# Patient Record
Sex: Female | Born: 1955 | Race: White | Hispanic: No | Marital: Married | Smoking: Never smoker
Health system: Southern US, Community
[De-identification: ages and names within clinical notes are randomized; demographics above are authoritative.]

## PROBLEM LIST (undated history)

## (undated) DIAGNOSIS — G43909 Migraine, unspecified, not intractable, without status migrainosus: Secondary | ICD-10-CM

## (undated) DIAGNOSIS — M8580 Other specified disorders of bone density and structure, unspecified site: Secondary | ICD-10-CM

## (undated) HISTORY — PX: TONSILLECTOMY: SUR1361

## (undated) HISTORY — DX: Other specified disorders of bone density and structure, unspecified site: M85.80

## (undated) HISTORY — PX: BREAST BIOPSY: SHX20

## (undated) HISTORY — DX: Migraine, unspecified, not intractable, without status migrainosus: G43.909

---

## 1981-06-03 HISTORY — PX: OTHER SURGICAL HISTORY: SHX169

## 1998-03-24 ENCOUNTER — Other Ambulatory Visit: Admission: RE | Admit: 1998-03-24 | Discharge: 1998-03-24 | Payer: Self-pay | Admitting: Obstetrics and Gynecology

## 1998-03-27 ENCOUNTER — Other Ambulatory Visit: Admission: RE | Admit: 1998-03-27 | Discharge: 1998-03-27 | Payer: Self-pay | Admitting: Obstetrics and Gynecology

## 1999-04-04 ENCOUNTER — Encounter: Payer: Self-pay | Admitting: Obstetrics and Gynecology

## 1999-04-04 ENCOUNTER — Encounter: Admission: RE | Admit: 1999-04-04 | Discharge: 1999-04-04 | Payer: Self-pay | Admitting: Obstetrics and Gynecology

## 1999-05-11 ENCOUNTER — Ambulatory Visit (HOSPITAL_BASED_OUTPATIENT_CLINIC_OR_DEPARTMENT_OTHER): Admission: RE | Admit: 1999-05-11 | Discharge: 1999-05-11 | Payer: Self-pay | Admitting: Orthopedic Surgery

## 1999-06-13 ENCOUNTER — Other Ambulatory Visit: Admission: RE | Admit: 1999-06-13 | Discharge: 1999-06-13 | Payer: Self-pay | Admitting: Obstetrics and Gynecology

## 2000-05-13 ENCOUNTER — Encounter: Admission: RE | Admit: 2000-05-13 | Discharge: 2000-05-13 | Payer: Self-pay | Admitting: Obstetrics and Gynecology

## 2000-05-13 ENCOUNTER — Encounter: Payer: Self-pay | Admitting: Obstetrics and Gynecology

## 2000-07-22 ENCOUNTER — Other Ambulatory Visit: Admission: RE | Admit: 2000-07-22 | Discharge: 2000-07-22 | Payer: Self-pay | Admitting: Obstetrics and Gynecology

## 2001-07-31 ENCOUNTER — Encounter: Payer: Self-pay | Admitting: Obstetrics and Gynecology

## 2001-07-31 ENCOUNTER — Encounter: Admission: RE | Admit: 2001-07-31 | Discharge: 2001-07-31 | Payer: Self-pay | Admitting: Obstetrics and Gynecology

## 2001-08-07 ENCOUNTER — Encounter: Admission: RE | Admit: 2001-08-07 | Discharge: 2001-08-07 | Payer: Self-pay | Admitting: Obstetrics and Gynecology

## 2001-08-07 ENCOUNTER — Encounter: Payer: Self-pay | Admitting: Obstetrics and Gynecology

## 2001-10-30 ENCOUNTER — Other Ambulatory Visit: Admission: RE | Admit: 2001-10-30 | Discharge: 2001-10-30 | Payer: Self-pay | Admitting: Obstetrics and Gynecology

## 2002-07-29 ENCOUNTER — Ambulatory Visit (HOSPITAL_COMMUNITY): Admission: RE | Admit: 2002-07-29 | Discharge: 2002-07-29 | Payer: Self-pay | Admitting: *Deleted

## 2002-07-29 ENCOUNTER — Encounter: Payer: Self-pay | Admitting: *Deleted

## 2002-08-18 ENCOUNTER — Encounter: Admission: RE | Admit: 2002-08-18 | Discharge: 2002-08-18 | Payer: Self-pay | Admitting: Obstetrics and Gynecology

## 2002-08-18 ENCOUNTER — Encounter: Payer: Self-pay | Admitting: Obstetrics and Gynecology

## 2002-08-25 ENCOUNTER — Encounter: Admission: RE | Admit: 2002-08-25 | Discharge: 2002-08-25 | Payer: Self-pay | Admitting: Obstetrics and Gynecology

## 2002-08-25 ENCOUNTER — Encounter: Payer: Self-pay | Admitting: Obstetrics and Gynecology

## 2002-11-09 ENCOUNTER — Other Ambulatory Visit: Admission: RE | Admit: 2002-11-09 | Discharge: 2002-11-09 | Payer: Self-pay | Admitting: Obstetrics and Gynecology

## 2003-06-04 HISTORY — PX: OTHER SURGICAL HISTORY: SHX169

## 2003-07-12 ENCOUNTER — Other Ambulatory Visit: Admission: RE | Admit: 2003-07-12 | Discharge: 2003-07-12 | Payer: Self-pay | Admitting: Obstetrics and Gynecology

## 2003-08-17 ENCOUNTER — Encounter: Admission: RE | Admit: 2003-08-17 | Discharge: 2003-08-17 | Payer: Self-pay | Admitting: Obstetrics and Gynecology

## 2003-10-13 ENCOUNTER — Encounter: Admission: RE | Admit: 2003-10-13 | Discharge: 2003-10-13 | Payer: Self-pay | Admitting: Obstetrics and Gynecology

## 2003-10-23 ENCOUNTER — Ambulatory Visit (HOSPITAL_COMMUNITY): Admission: RE | Admit: 2003-10-23 | Discharge: 2003-10-23 | Payer: Self-pay | Admitting: Interventional Radiology

## 2003-11-21 ENCOUNTER — Observation Stay (HOSPITAL_COMMUNITY): Admission: RE | Admit: 2003-11-21 | Discharge: 2003-11-22 | Payer: Self-pay | Admitting: Interventional Radiology

## 2004-05-03 ENCOUNTER — Other Ambulatory Visit: Admission: RE | Admit: 2004-05-03 | Discharge: 2004-05-03 | Payer: Self-pay | Admitting: Obstetrics and Gynecology

## 2004-05-11 ENCOUNTER — Encounter: Admission: RE | Admit: 2004-05-11 | Discharge: 2004-05-11 | Payer: Self-pay | Admitting: Obstetrics and Gynecology

## 2005-05-07 ENCOUNTER — Other Ambulatory Visit: Admission: RE | Admit: 2005-05-07 | Discharge: 2005-05-07 | Payer: Self-pay | Admitting: Obstetrics and Gynecology

## 2005-05-15 ENCOUNTER — Ambulatory Visit: Payer: Self-pay | Admitting: Gastroenterology

## 2005-05-29 ENCOUNTER — Ambulatory Visit: Payer: Self-pay | Admitting: Gastroenterology

## 2005-08-16 ENCOUNTER — Encounter: Admission: RE | Admit: 2005-08-16 | Discharge: 2005-08-16 | Payer: Self-pay | Admitting: Obstetrics and Gynecology

## 2005-09-09 ENCOUNTER — Encounter: Admission: RE | Admit: 2005-09-09 | Discharge: 2005-09-09 | Payer: Self-pay | Admitting: Obstetrics and Gynecology

## 2006-09-26 ENCOUNTER — Encounter: Admission: RE | Admit: 2006-09-26 | Discharge: 2006-09-26 | Payer: Self-pay | Admitting: Obstetrics and Gynecology

## 2007-10-28 ENCOUNTER — Encounter: Admission: RE | Admit: 2007-10-28 | Discharge: 2007-10-28 | Payer: Self-pay | Admitting: Obstetrics and Gynecology

## 2007-11-11 ENCOUNTER — Encounter: Admission: RE | Admit: 2007-11-11 | Discharge: 2007-11-11 | Payer: Self-pay | Admitting: Obstetrics and Gynecology

## 2008-10-28 ENCOUNTER — Encounter: Admission: RE | Admit: 2008-10-28 | Discharge: 2008-10-28 | Payer: Self-pay | Admitting: Obstetrics and Gynecology

## 2009-10-13 ENCOUNTER — Emergency Department (HOSPITAL_COMMUNITY): Admission: EM | Admit: 2009-10-13 | Discharge: 2009-10-13 | Payer: Self-pay | Admitting: Emergency Medicine

## 2009-11-13 ENCOUNTER — Encounter: Admission: RE | Admit: 2009-11-13 | Discharge: 2009-11-13 | Payer: Self-pay | Admitting: Obstetrics and Gynecology

## 2010-06-23 ENCOUNTER — Encounter: Payer: Self-pay | Admitting: Interventional Radiology

## 2010-11-02 ENCOUNTER — Other Ambulatory Visit: Payer: Self-pay | Admitting: Obstetrics and Gynecology

## 2010-11-02 DIAGNOSIS — Z1231 Encounter for screening mammogram for malignant neoplasm of breast: Secondary | ICD-10-CM

## 2010-11-19 ENCOUNTER — Ambulatory Visit
Admission: RE | Admit: 2010-11-19 | Discharge: 2010-11-19 | Disposition: A | Discharge status: Home / Self Care | Payer: BC Managed Care – PPO | Source: Ambulatory Visit | Attending: Obstetrics and Gynecology | Admitting: Obstetrics and Gynecology

## 2010-11-19 DIAGNOSIS — Z1231 Encounter for screening mammogram for malignant neoplasm of breast: Secondary | ICD-10-CM

## 2010-11-20 ENCOUNTER — Other Ambulatory Visit: Payer: Self-pay | Admitting: Obstetrics and Gynecology

## 2010-11-20 DIAGNOSIS — R928 Other abnormal and inconclusive findings on diagnostic imaging of breast: Secondary | ICD-10-CM

## 2010-11-27 ENCOUNTER — Ambulatory Visit
Admission: RE | Admit: 2010-11-27 | Discharge: 2010-11-27 | Disposition: A | Discharge status: Home / Self Care | Payer: BC Managed Care – PPO | Source: Ambulatory Visit | Attending: Obstetrics and Gynecology | Admitting: Obstetrics and Gynecology

## 2010-11-27 ENCOUNTER — Other Ambulatory Visit: Payer: Self-pay | Admitting: Obstetrics and Gynecology

## 2010-11-27 DIAGNOSIS — R928 Other abnormal and inconclusive findings on diagnostic imaging of breast: Secondary | ICD-10-CM

## 2010-12-03 ENCOUNTER — Ambulatory Visit
Admission: RE | Admit: 2010-12-03 | Discharge: 2010-12-03 | Disposition: A | Discharge status: Home / Self Care | Payer: BC Managed Care – PPO | Source: Ambulatory Visit | Attending: Obstetrics and Gynecology | Admitting: Obstetrics and Gynecology

## 2010-12-03 ENCOUNTER — Other Ambulatory Visit: Payer: Self-pay | Admitting: Obstetrics and Gynecology

## 2010-12-03 ENCOUNTER — Other Ambulatory Visit: Payer: Self-pay | Admitting: Diagnostic Radiology

## 2010-12-03 DIAGNOSIS — R928 Other abnormal and inconclusive findings on diagnostic imaging of breast: Secondary | ICD-10-CM

## 2010-12-03 DIAGNOSIS — N6320 Unspecified lump in the left breast, unspecified quadrant: Secondary | ICD-10-CM

## 2011-10-21 ENCOUNTER — Other Ambulatory Visit: Payer: Self-pay | Admitting: Obstetrics and Gynecology

## 2011-10-21 DIAGNOSIS — Z1231 Encounter for screening mammogram for malignant neoplasm of breast: Secondary | ICD-10-CM

## 2011-11-26 ENCOUNTER — Ambulatory Visit
Admission: RE | Admit: 2011-11-26 | Discharge: 2011-11-26 | Disposition: A | Discharge status: Home / Self Care | Payer: BC Managed Care – PPO | Source: Ambulatory Visit | Attending: Obstetrics and Gynecology | Admitting: Obstetrics and Gynecology

## 2011-11-26 DIAGNOSIS — Z1231 Encounter for screening mammogram for malignant neoplasm of breast: Secondary | ICD-10-CM

## 2012-10-27 ENCOUNTER — Other Ambulatory Visit: Payer: Self-pay

## 2012-10-27 DIAGNOSIS — Z1231 Encounter for screening mammogram for malignant neoplasm of breast: Secondary | ICD-10-CM

## 2012-11-27 ENCOUNTER — Ambulatory Visit
Admission: RE | Admit: 2012-11-27 | Discharge: 2012-11-27 | Disposition: A | Discharge status: Home / Self Care | Payer: BC Managed Care – PPO | Source: Ambulatory Visit

## 2012-11-27 DIAGNOSIS — Z1231 Encounter for screening mammogram for malignant neoplasm of breast: Secondary | ICD-10-CM

## 2013-10-22 ENCOUNTER — Other Ambulatory Visit: Payer: Self-pay | Admitting: Physician Assistant

## 2013-11-08 ENCOUNTER — Other Ambulatory Visit: Payer: Self-pay

## 2013-11-08 DIAGNOSIS — Z1231 Encounter for screening mammogram for malignant neoplasm of breast: Secondary | ICD-10-CM

## 2013-11-18 ENCOUNTER — Other Ambulatory Visit: Payer: Self-pay | Admitting: Physician Assistant

## 2013-12-06 ENCOUNTER — Ambulatory Visit
Admission: RE | Admit: 2013-12-06 | Discharge: 2013-12-06 | Disposition: A | Discharge status: Home / Self Care | Payer: BC Managed Care – PPO | Source: Ambulatory Visit

## 2013-12-06 DIAGNOSIS — Z1231 Encounter for screening mammogram for malignant neoplasm of breast: Secondary | ICD-10-CM

## 2014-10-11 ENCOUNTER — Other Ambulatory Visit: Payer: Self-pay

## 2014-10-12 LAB — CYTOLOGY - PAP

## 2014-11-11 ENCOUNTER — Other Ambulatory Visit: Payer: Self-pay

## 2014-11-11 DIAGNOSIS — Z1231 Encounter for screening mammogram for malignant neoplasm of breast: Secondary | ICD-10-CM

## 2014-12-16 ENCOUNTER — Ambulatory Visit
Admission: RE | Admit: 2014-12-16 | Discharge: 2014-12-16 | Disposition: A | Discharge status: Home / Self Care | Payer: BC Managed Care – PPO | Source: Ambulatory Visit

## 2014-12-16 DIAGNOSIS — Z1231 Encounter for screening mammogram for malignant neoplasm of breast: Secondary | ICD-10-CM

## 2015-04-07 ENCOUNTER — Encounter: Payer: Self-pay | Admitting: Gastroenterology

## 2015-10-26 ENCOUNTER — Encounter: Payer: Self-pay | Admitting: Gastroenterology

## 2015-12-08 ENCOUNTER — Other Ambulatory Visit: Payer: Self-pay | Admitting: Obstetrics and Gynecology

## 2015-12-08 DIAGNOSIS — Z1231 Encounter for screening mammogram for malignant neoplasm of breast: Secondary | ICD-10-CM

## 2015-12-13 ENCOUNTER — Ambulatory Visit (AMBULATORY_SURGERY_CENTER): Payer: Self-pay | Admitting: *Deleted

## 2015-12-13 VITALS — Ht 66.0 in | Wt 142.0 lb

## 2015-12-13 DIAGNOSIS — Z1211 Encounter for screening for malignant neoplasm of colon: Secondary | ICD-10-CM

## 2015-12-13 MED ORDER — NA SULFATE-K SULFATE-MG SULF 17.5-3.13-1.6 GM/177ML PO SOLN: 1.0000 | Freq: Once | ORAL | Status: DC

## 2015-12-13 NOTE — Progress Notes (Signed)
Denies allergies to eggs or soy products. Denies complications with sedation or anesthesia. Denies O2 use. Denies use of diet or weight loss medications.  Emmi instructions given for colonoscopy.  

## 2015-12-14 ENCOUNTER — Encounter: Payer: Self-pay | Admitting: Gastroenterology

## 2015-12-19 ENCOUNTER — Ambulatory Visit
Admission: RE | Admit: 2015-12-19 | Discharge: 2015-12-19 | Disposition: A | Discharge status: Home / Self Care | Payer: BC Managed Care – PPO | Source: Ambulatory Visit | Attending: Obstetrics and Gynecology | Admitting: Obstetrics and Gynecology

## 2015-12-19 DIAGNOSIS — Z1231 Encounter for screening mammogram for malignant neoplasm of breast: Secondary | ICD-10-CM

## 2015-12-27 ENCOUNTER — Ambulatory Visit (AMBULATORY_SURGERY_CENTER): Payer: BC Managed Care – PPO | Admitting: Gastroenterology

## 2015-12-27 ENCOUNTER — Encounter: Payer: Self-pay | Admitting: Gastroenterology

## 2015-12-27 VITALS — BP 125/74 | HR 60 | Temp 98.2°F | Resp 12 | Ht 66.0 in | Wt 142.0 lb

## 2015-12-27 DIAGNOSIS — Z1211 Encounter for screening for malignant neoplasm of colon: Secondary | ICD-10-CM | POA: Diagnosis not present

## 2015-12-27 NOTE — Progress Notes (Signed)
Pt. Placed on all 4 to expel air and ambulated to bathroom,pt started passing air,but stated she still felt some lower down abdomen remained soft,made doctor aware and pt. Decided that she wanted to go home she would feel more comfortable there,instructed pt. To call if she had any problems and encouraged to drink warm fluids,she and husband verbalize understanding,also offered pt. More time to stay in recovery to see if she was going to pass more air,she stated no she wanted to go home. Pt. D/c.

## 2015-12-27 NOTE — Patient Instructions (Signed)
YOU HAD AN ENDOSCOPIC PROCEDURE TODAY AT THE [facility]:   Refer to the procedure report that was given to you for any specific questions about what was found during the examination.  If the procedure report does not answer your questions, please call your gastroenterologist to clarify.  If you requested that your care partner not be given the details of your procedure findings, then the procedure report has been included in a sealed envelope for you to review at your convenience later.  YOU SHOULD EXPECT: Some feelings of bloating in the abdomen. Passage of more gas than usual.  Walking can help get rid of the air that was put into your GI tract during the procedure and reduce the bloating. If you had a lower endoscopy (such as a colonoscopy or flexible sigmoidoscopy) you may notice spotting of blood in your stool or on the toilet paper. If you underwent a bowel prep for your procedure, you may not have a normal bowel movement for a few days.  Please Note:  You might notice some irritation and congestion in your nose or some drainage.  This is from the oxygen used during your procedure.  There is no need for concern and it should clear up in a day or so.  SYMPTOMS TO REPORT IMMEDIATELY:   Following lower endoscopy (colonoscopy or flexible sigmoidoscopy):  Excessive amounts of blood in the stool  Significant tenderness or worsening of abdominal pains  Swelling of the abdomen that is new, acute  Fever of 100F or higher   For urgent or emergent issues, a gastroenterologist can be reached at any hour by calling [phone].   DIET: Your first meal following the procedure should be a small meal and then it is ok to progress to your normal diet. Heavy or fried foods are harder to digest and may make you feel nauseous or bloated.  Likewise, meals heavy in dairy and vegetables can increase bloating.  Drink plenty of fluids but you should avoid alcoholic beverages for 24  hours.  ACTIVITY:  You should plan to take it easy for the rest of today and you should NOT DRIVE or use heavy machinery until tomorrow (because of the sedation medicines used during the test).    FOLLOW UP: Our staff will call the number listed on your records the next business day following your procedure to check on you and address any questions or concerns that you may have regarding the information given to you following your procedure. If we do not reach you, we will leave a message.  However, if you are feeling well and you are not experiencing any problems, there is no need to return our call.  We will assume that you have returned to your regular daily activities without incident.  If any biopsies were taken you will be contacted by phone or by letter within the next 1-3 weeks.  Please call us at [phone] if you have not heard about the biopsies in 3 weeks.    SIGNATURES/CONFIDENTIALITY: You and/or your care partner have signed paperwork which will be entered into your electronic medical record.  These signatures attest to the fact that that the information above on your After Visit Summary has been reviewed and is understood.  Full responsibility of the confidentiality of this discharge information lies with you and/or your care-partner.   Resume medications. 

## 2015-12-27 NOTE — Op Note (Signed)
[facility] Patient Name: Brenda Padilla Procedure Date: 12/27/2015 1:53 PM MRN: [id number] Endoscopist: Rachael Fee , MD Age: 60 Referring MD:  Date of Birth: 1955-12-17 Gender: Female Account #: [id number] Procedure:                Colonoscopy Indications:              Screening for colorectal malignant neoplasm;                            Colonoscopy Dr. Christella Hartigan 2006-no polyps Medicines:                Monitored Anesthesia Care Procedure:                Pre-Anesthesia Assessment:                           - Prior to the procedure, a History and Physical                            was performed, and patient medications and                            allergies were reviewed. The patient's tolerance of                            previous anesthesia was also reviewed. The risks                            and benefits of the procedure and the sedation                            options and risks were discussed with the patient.                            All questions were answered, and informed consent                            was obtained. Prior Anticoagulants: The patient has                            taken no previous anticoagulant or antiplatelet                            agents. ASA Grade Assessment: II - A patient with                            mild systemic disease. After reviewing the risks                            and benefits, the patient was deemed in                            satisfactory condition to undergo the procedure.  After obtaining informed consent, the colonoscope                            was passed under direct vision. Throughout the                            procedure, the patient's blood pressure, pulse, and                            oxygen saturations were monitored continuously. The                            Model CF-HQ190L [id number]) scope was introduced                            through the anus and  advanced to the the cecum,                            identified by appendiceal orifice and ileocecal                            valve. The colonoscopy was performed without                            difficulty. The patient tolerated the procedure                            well. The quality of the bowel preparation was                            excellent. The ileocecal valve, appendiceal                            orifice, and rectum were photographed. Scope In: 2:12:07 PM Scope Out: 2:23:41 PM Scope Withdrawal Time: 0 hours 6 minutes 0 seconds  Total Procedure Duration: 0 hours 11 minutes 34 seconds  Findings:                 The entire examined colon appeared normal on direct                            and retroflexion views. Complications:            No immediate complications. Estimated blood loss:                            None. Estimated Blood Loss:     Estimated blood loss: none. Impression:               - The entire examined colon is normal on direct and                            retroflexion views.                           - No polyps or cancers  Recommendation:           - Patient has a contact number available for                            emergencies. The signs and symptoms of potential                            delayed complications were discussed with the                            patient. Return to normal activities tomorrow.                            Written discharge instructions were provided to the                            patient.                           - Resume previous diet.                           - Continue present medications.                           - Repeat colonoscopy in 10 years for screening                            purposes. There is no need for colon cancer                            screening by any method (including stool testing)                            prior to then. Rachael Fee, MD 12/27/2015 2:25:45 PM This report has  been signed electronically.

## 2015-12-27 NOTE — Progress Notes (Signed)
Report to PACU, RN, vss, BBS= Clear.  

## 2015-12-28 ENCOUNTER — Telehealth: Payer: Self-pay | Admitting: *Deleted

## 2015-12-28 NOTE — Telephone Encounter (Signed)
  Follow up Call-  Call back number 12/27/2015  Post procedure Call Back phone  # [phone]  Permission to leave phone message Yes  Some recent data might be hidden    [facility]

## 2016-11-29 ENCOUNTER — Other Ambulatory Visit: Payer: Self-pay | Admitting: Obstetrics and Gynecology

## 2016-11-29 DIAGNOSIS — Z1231 Encounter for screening mammogram for malignant neoplasm of breast: Secondary | ICD-10-CM

## 2017-01-10 ENCOUNTER — Ambulatory Visit
Admission: RE | Admit: 2017-01-10 | Discharge: 2017-01-10 | Disposition: A | Discharge status: Home / Self Care | Payer: BC Managed Care – PPO | Source: Ambulatory Visit | Attending: Obstetrics and Gynecology | Admitting: Obstetrics and Gynecology

## 2017-01-10 DIAGNOSIS — Z1231 Encounter for screening mammogram for malignant neoplasm of breast: Secondary | ICD-10-CM

## 2017-12-09 ENCOUNTER — Other Ambulatory Visit: Payer: Self-pay | Admitting: Obstetrics and Gynecology

## 2017-12-09 DIAGNOSIS — Z1231 Encounter for screening mammogram for malignant neoplasm of breast: Secondary | ICD-10-CM

## 2018-01-12 ENCOUNTER — Ambulatory Visit
Admission: RE | Admit: 2018-01-12 | Discharge: 2018-01-12 | Disposition: A | Discharge status: Home / Self Care | Payer: BC Managed Care – PPO | Source: Ambulatory Visit | Attending: Obstetrics and Gynecology | Admitting: Obstetrics and Gynecology

## 2018-01-12 DIAGNOSIS — Z1231 Encounter for screening mammogram for malignant neoplasm of breast: Secondary | ICD-10-CM

## 2018-12-07 ENCOUNTER — Other Ambulatory Visit: Payer: Self-pay | Admitting: Obstetrics and Gynecology

## 2018-12-07 DIAGNOSIS — Z1231 Encounter for screening mammogram for malignant neoplasm of breast: Secondary | ICD-10-CM

## 2019-01-15 ENCOUNTER — Other Ambulatory Visit: Payer: Self-pay

## 2019-01-15 ENCOUNTER — Ambulatory Visit
Admission: RE | Admit: 2019-01-15 | Discharge: 2019-01-15 | Disposition: A | Discharge status: Home / Self Care | Payer: BC Managed Care – PPO | Source: Ambulatory Visit | Attending: Obstetrics and Gynecology | Admitting: Obstetrics and Gynecology

## 2019-01-15 DIAGNOSIS — Z1231 Encounter for screening mammogram for malignant neoplasm of breast: Secondary | ICD-10-CM

## 2019-12-15 ENCOUNTER — Other Ambulatory Visit: Payer: Self-pay | Admitting: Obstetrics and Gynecology

## 2019-12-15 DIAGNOSIS — Z1231 Encounter for screening mammogram for malignant neoplasm of breast: Secondary | ICD-10-CM

## 2020-01-17 ENCOUNTER — Other Ambulatory Visit: Payer: Self-pay

## 2020-01-17 ENCOUNTER — Ambulatory Visit
Admission: RE | Admit: 2020-01-17 | Discharge: 2020-01-17 | Disposition: A | Discharge status: Home / Self Care | Payer: Medicare PPO | Source: Ambulatory Visit | Attending: Obstetrics and Gynecology | Admitting: Obstetrics and Gynecology

## 2020-01-17 DIAGNOSIS — Z1231 Encounter for screening mammogram for malignant neoplasm of breast: Secondary | ICD-10-CM

## 2020-04-06 ENCOUNTER — Telehealth: Payer: Self-pay | Admitting: Physician Assistant

## 2020-04-06 DIAGNOSIS — L309 Dermatitis, unspecified: Secondary | ICD-10-CM

## 2020-04-06 NOTE — Telephone Encounter (Signed)
K[facility] patient. Wants refill for Trianex which she says was ordered from [facility]. (mail order pharmacy). [id number]

## 2020-04-12 MED ORDER — TRIAMCINOLONE ACETONIDE 0.1 % EX CREA: 1.0000 "application " | TOPICAL_CREAM | Freq: Every day | CUTANEOUS | 2 refills | Status: AC | PRN

## 2020-04-12 NOTE — Telephone Encounter (Signed)
Phone call from patient wanting refill on Trianex; informed patient that the pharmacy we used for that medication has closed. Informed patient that its very hard to get that covered now with the pharmacy being closed.  Patient understood.  Per Dr. Jorja Loa okay to give patient Triamcinolone Cream because its more affordable.  Did remind patient that Triamcinolone cream is too strong for face, fold or groin.  Patient understood. Sent to patient pharmacy and told her to call us if needed.

## 2021-01-26 ENCOUNTER — Other Ambulatory Visit: Payer: Self-pay | Admitting: Obstetrics and Gynecology

## 2021-01-26 DIAGNOSIS — Z1231 Encounter for screening mammogram for malignant neoplasm of breast: Secondary | ICD-10-CM

## 2021-03-15 ENCOUNTER — Other Ambulatory Visit: Payer: Self-pay

## 2021-03-15 ENCOUNTER — Ambulatory Visit
Admission: RE | Admit: 2021-03-15 | Discharge: 2021-03-15 | Disposition: A | Discharge status: Home / Self Care | Payer: Medicare PPO | Source: Ambulatory Visit | Attending: Obstetrics and Gynecology | Admitting: Obstetrics and Gynecology

## 2021-03-15 DIAGNOSIS — Z1231 Encounter for screening mammogram for malignant neoplasm of breast: Secondary | ICD-10-CM

## 2023-08-20 IMAGING — MG MM DIGITAL SCREENING BILAT W/ TOMO AND CAD
8 series · 9 of 24 positions shown · non-contrast
Comparison: Previous exam(s).

CLINICAL DATA: Screening.

EXAM:
DIGITAL SCREENING BILATERAL MAMMOGRAM WITH TOMOSYNTHESIS AND CAD
TECHNIQUE: Bilateral screening digital craniocaudal and mediolateral oblique
mammograms were obtained. Bilateral screening digital breast
tomosynthesis was performed. The images were evaluated with
computer-aided detection.

[L CC synth-2D]
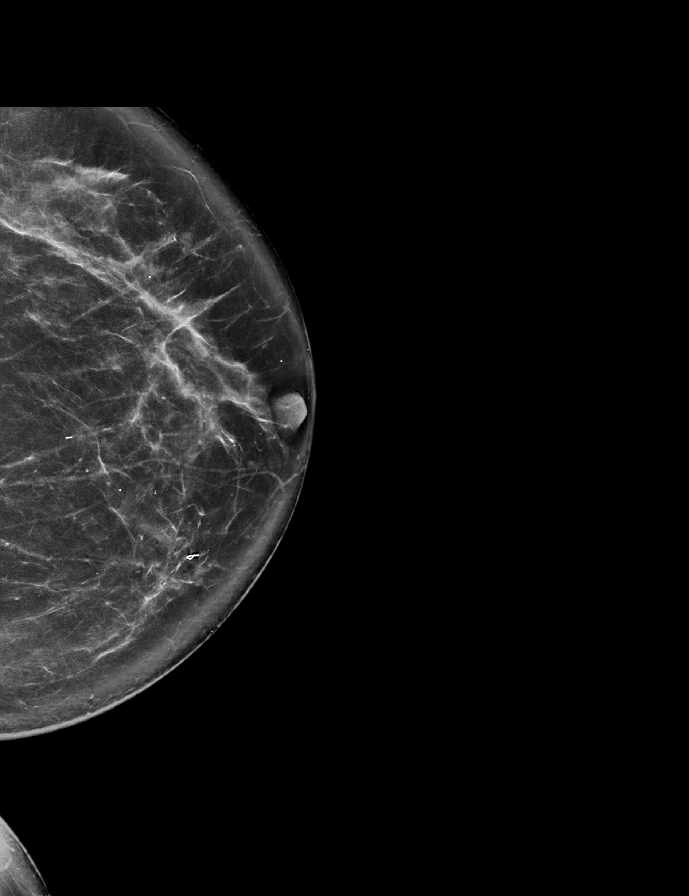

[L MLO synth-2D]
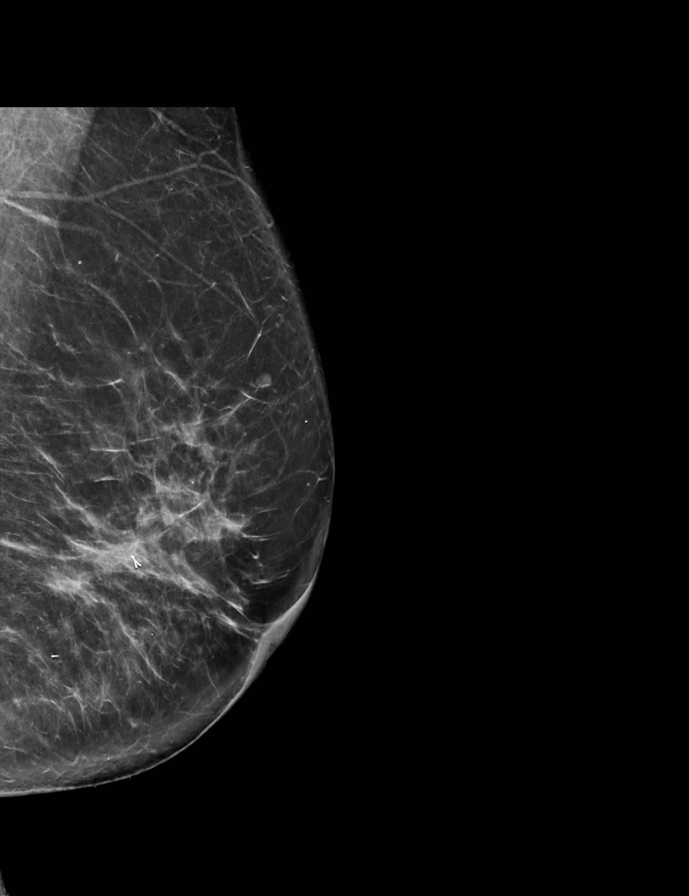

[R MLO synth-2D]
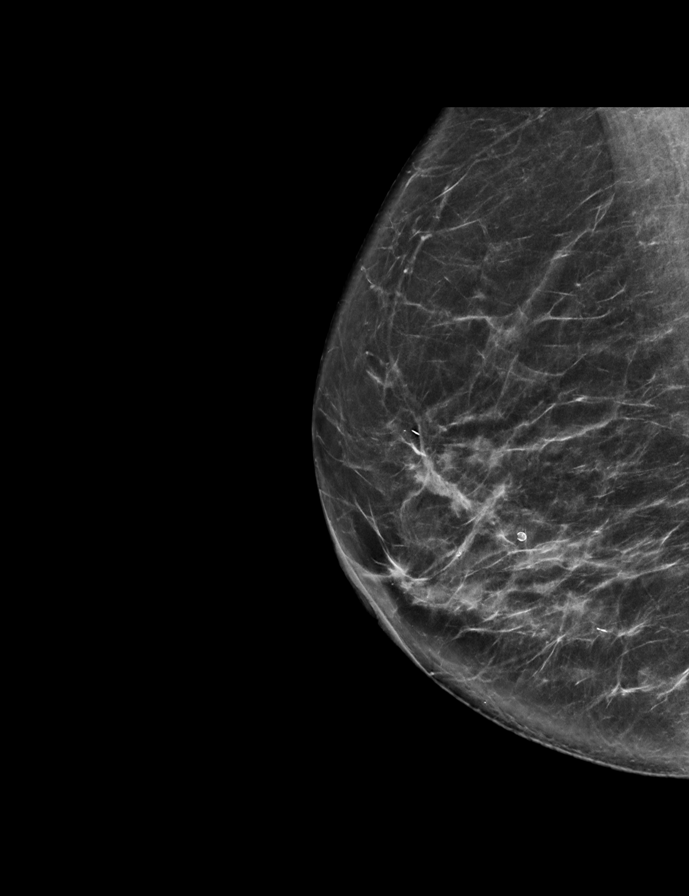

[R CC synth-2D]
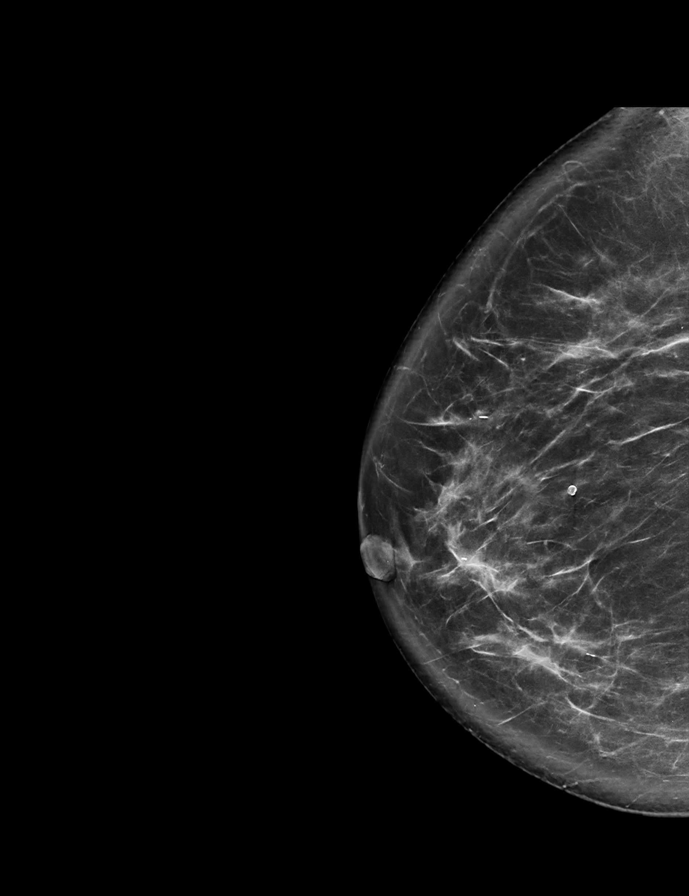

[L CC tomo · 2 of 82 frames shown]
[frame 27/82]
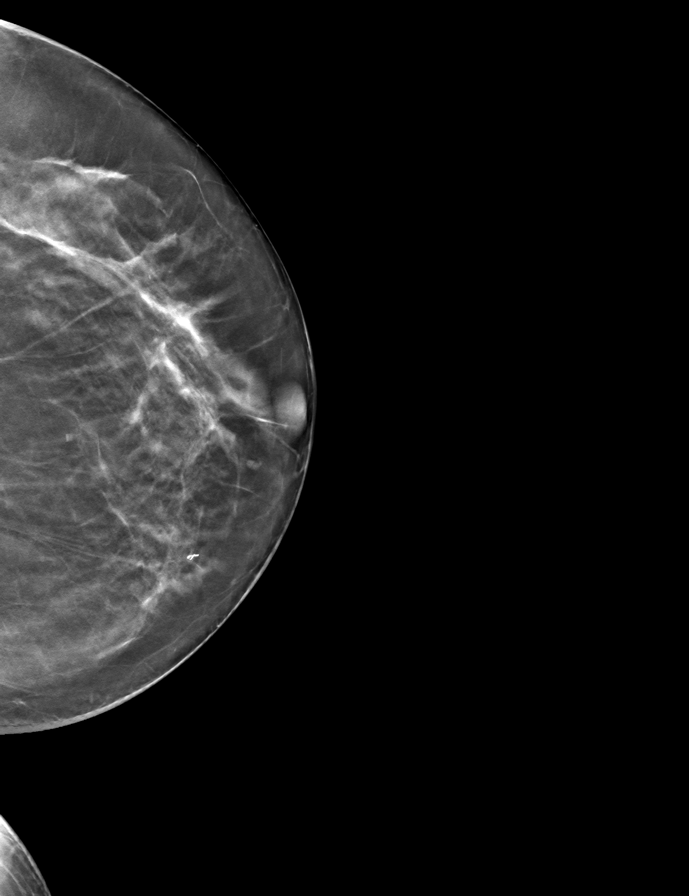
[frame 41/82]
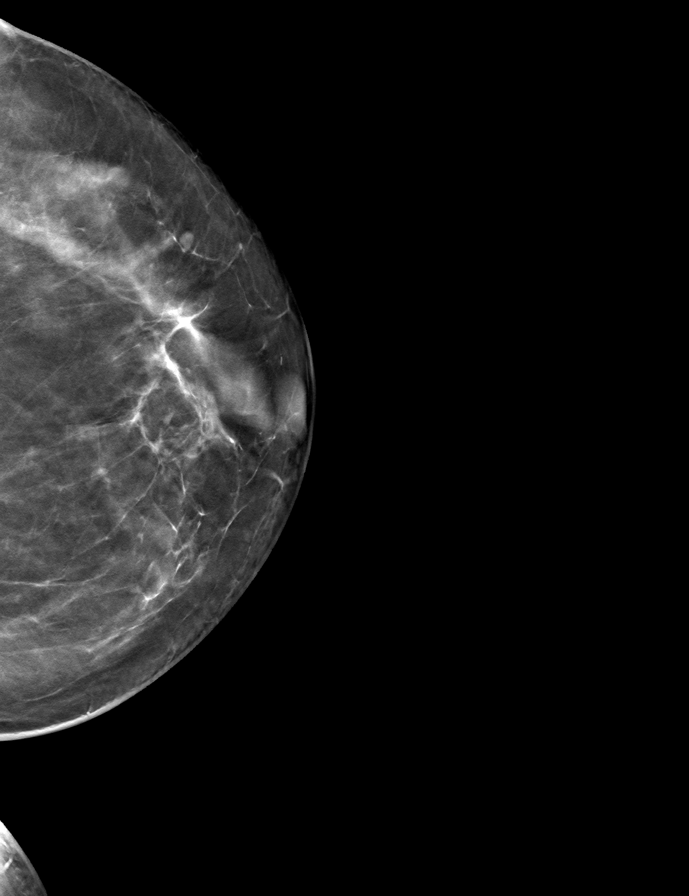

[L MLO tomo · tomo slice 39/78.0]
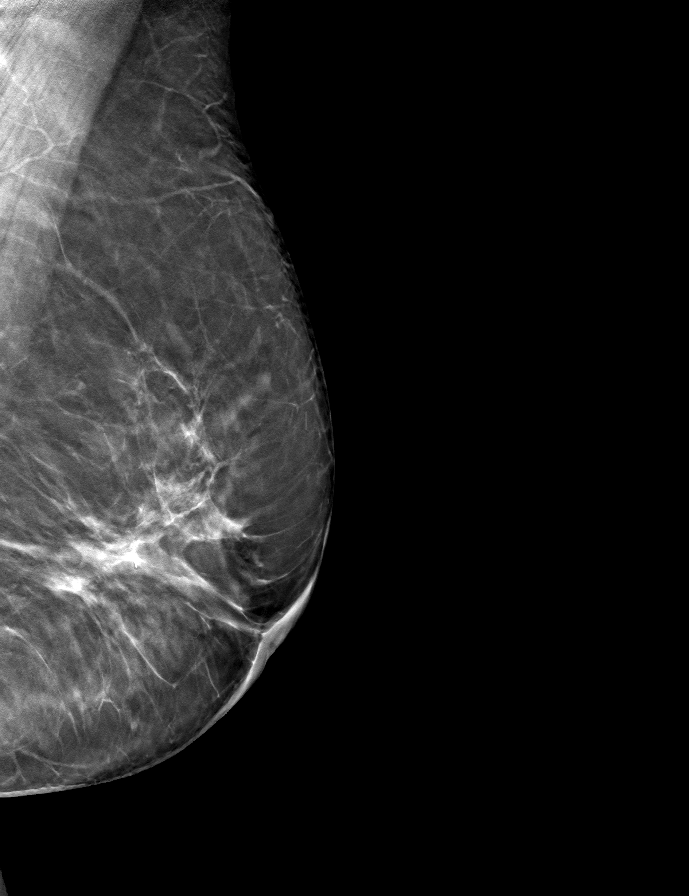

[R MLO tomo · tomo slice 39/78.0]
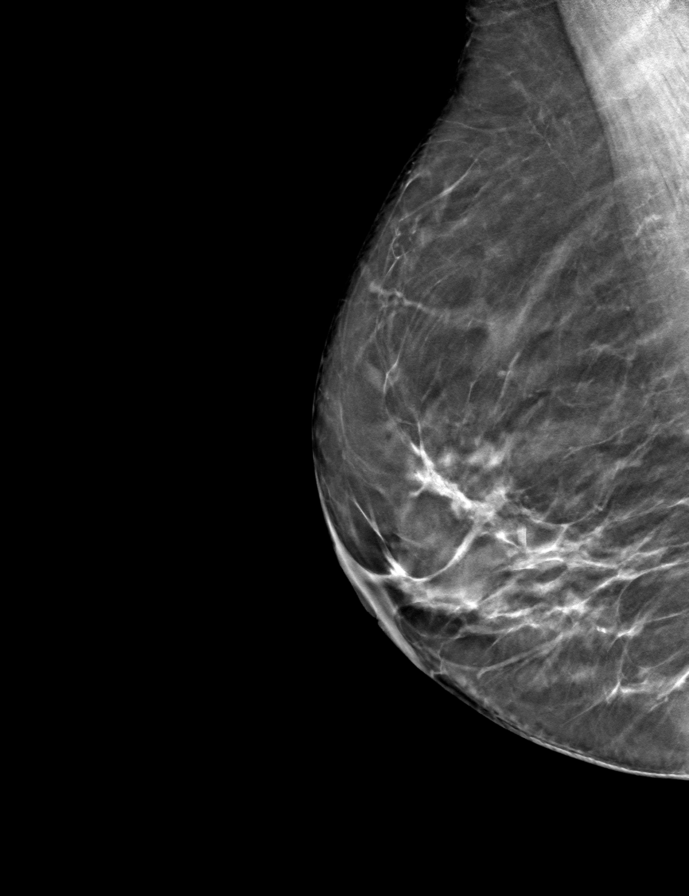

[R CC tomo · tomo slice 39/78.0]
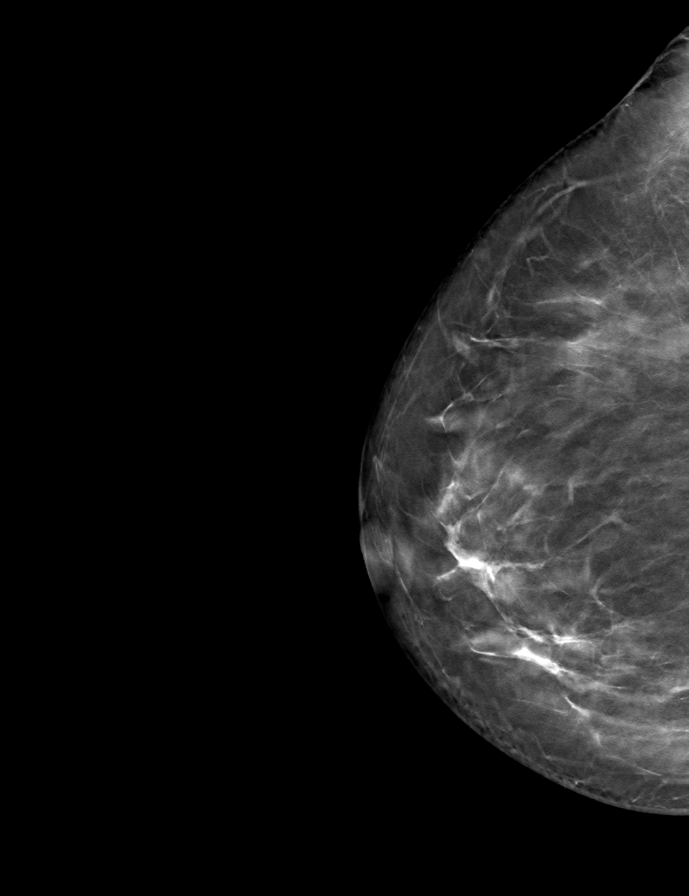

[9 of 24 positions shown; findings below may reference images not displayed]

ACR Breast Density Category b: There are scattered areas of
fibroglandular density.
FINDINGS: There are no findings suspicious for malignancy.
IMPRESSION: No mammographic evidence of malignancy. A result letter of this
screening mammogram will be mailed directly to the patient.

RECOMMENDATION:
Screening mammogram in one year. (Code:51-O-LD2)

BI-RADS CATEGORY  1: Negative.
